# Patient Record
Sex: Female | Born: 1960 | Race: Black or African American | Hispanic: No | Marital: Married
Health system: Southern US, Community
[De-identification: ages and names within clinical notes are randomized; demographics above are authoritative.]

---

## 1999-01-22 ENCOUNTER — Encounter: Payer: Self-pay | Admitting: Obstetrics and Gynecology

## 1999-01-22 ENCOUNTER — Ambulatory Visit (HOSPITAL_COMMUNITY): Admission: RE | Admit: 1999-01-22 | Discharge: 1999-01-22 | Payer: Self-pay | Admitting: Obstetrics and Gynecology

## 1999-06-26 ENCOUNTER — Inpatient Hospital Stay (HOSPITAL_COMMUNITY): Admission: AD | Admit: 1999-06-26 | Discharge: 1999-06-28 | Payer: Self-pay | Admitting: Obstetrics & Gynecology

## 2000-12-20 ENCOUNTER — Other Ambulatory Visit: Admission: RE | Admit: 2000-12-20 | Discharge: 2000-12-20 | Payer: Self-pay | Admitting: *Deleted

## 2001-10-11 ENCOUNTER — Encounter (INDEPENDENT_AMBULATORY_CARE_PROVIDER_SITE_OTHER): Payer: Self-pay | Admitting: Specialist

## 2001-10-11 ENCOUNTER — Ambulatory Visit (HOSPITAL_COMMUNITY): Admission: RE | Admit: 2001-10-11 | Discharge: 2001-10-11 | Payer: Self-pay | Admitting: *Deleted

## 2002-01-09 ENCOUNTER — Other Ambulatory Visit: Admission: RE | Admit: 2002-01-09 | Discharge: 2002-01-09 | Payer: Self-pay | Admitting: *Deleted

## 2004-05-03 ENCOUNTER — Other Ambulatory Visit: Admission: RE | Admit: 2004-05-03 | Discharge: 2004-05-03 | Payer: Self-pay | Admitting: *Deleted

## 2004-09-01 ENCOUNTER — Ambulatory Visit (HOSPITAL_COMMUNITY): Admission: RE | Admit: 2004-09-01 | Discharge: 2004-09-01 | Payer: Self-pay | Admitting: *Deleted

## 2005-05-30 ENCOUNTER — Other Ambulatory Visit: Admission: RE | Admit: 2005-05-30 | Discharge: 2005-05-30 | Payer: Self-pay | Admitting: *Deleted

## 2005-08-17 ENCOUNTER — Encounter: Admission: RE | Admit: 2005-08-17 | Discharge: 2005-08-17 | Payer: Self-pay | Admitting: Endocrinology

## 2006-03-13 ENCOUNTER — Ambulatory Visit (HOSPITAL_COMMUNITY): Admission: RE | Admit: 2006-03-13 | Discharge: 2006-03-13 | Payer: Self-pay | Admitting: *Deleted

## 2006-06-26 ENCOUNTER — Other Ambulatory Visit: Admission: RE | Admit: 2006-06-26 | Discharge: 2006-06-26 | Payer: Self-pay | Admitting: *Deleted

## 2006-07-05 IMAGING — US US SOFT TISSUE HEAD/NECK
1 series · 14 of 25 positions shown · non-contrast
Comparison: none

CLINICAL DATA: Possible thyroid nodules felt on physical examination.
 THYROID ULTRASOUND:
TECHNIQUE: Ultrasound examination of the thyroid gland and adjacent soft tissue structures was performed.

[Series 1: us soft tissue head/neck · 0.08mm/px · 14 of 26 slices shown]
[im 1/26]
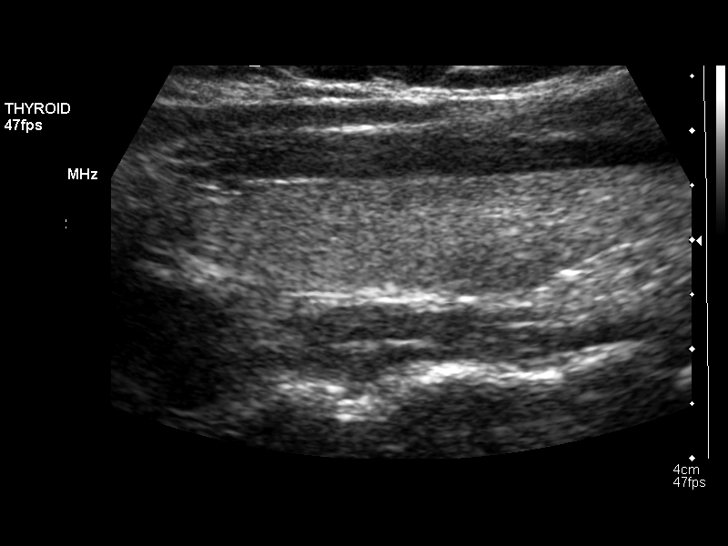
[im 3/26]
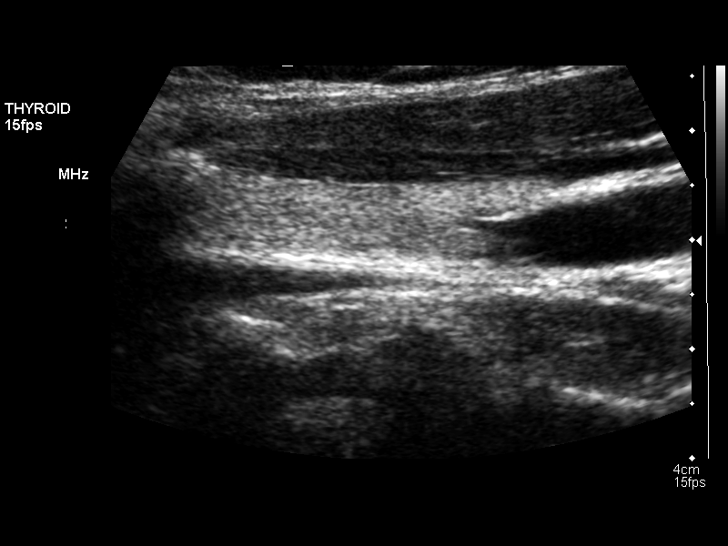
[im 5/26]
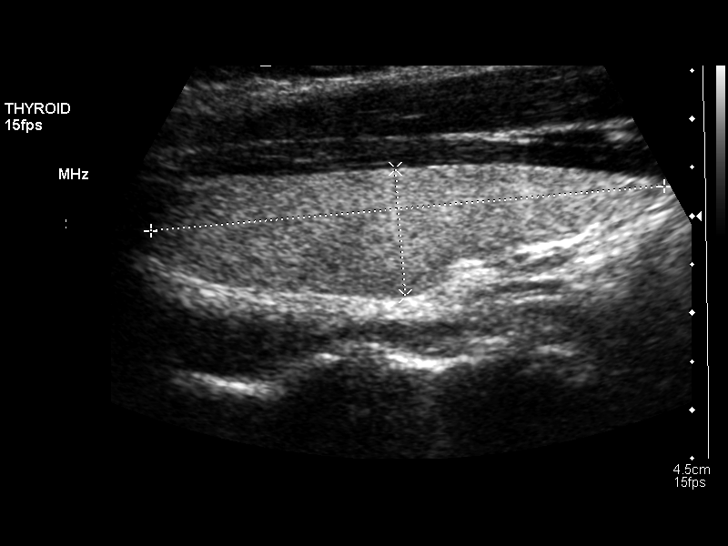
[im 7/26]
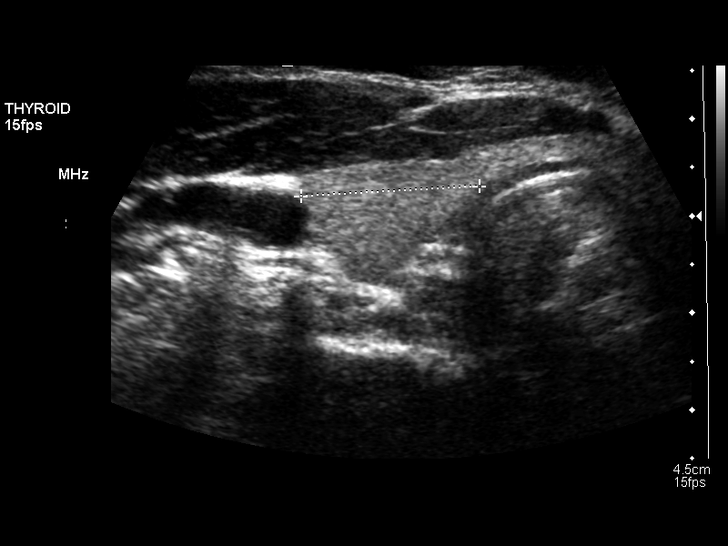
[im 9/26]
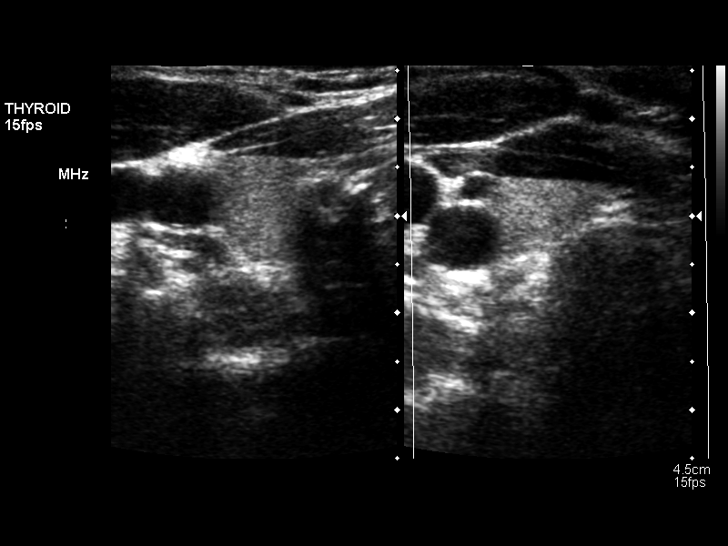
[im 10/26]
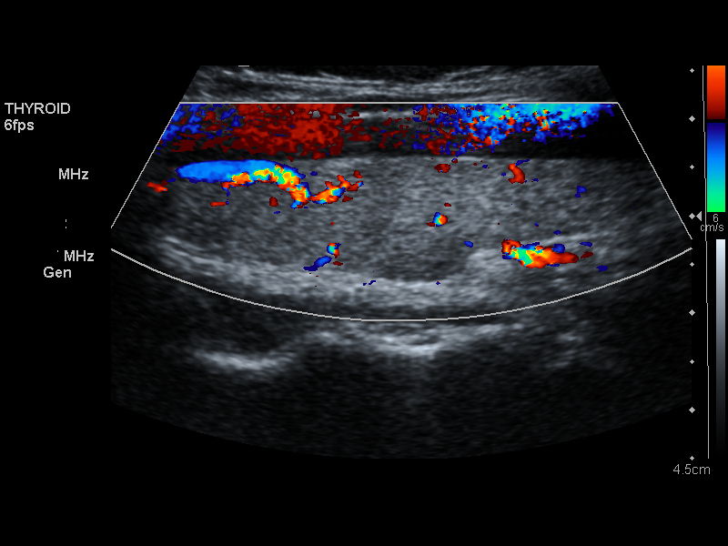
[im 12/26]
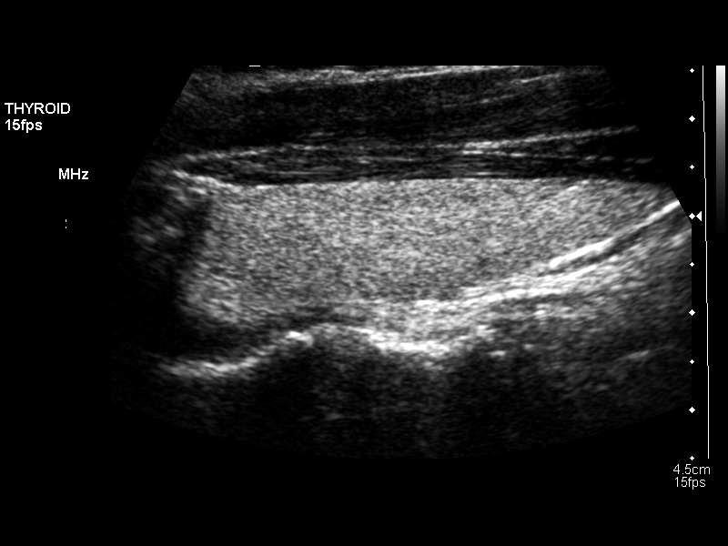
[im 14/26]
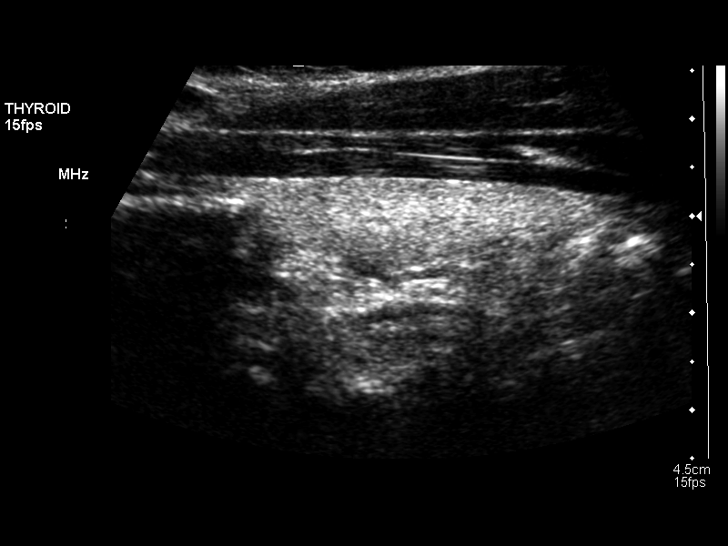
[im 16/26]
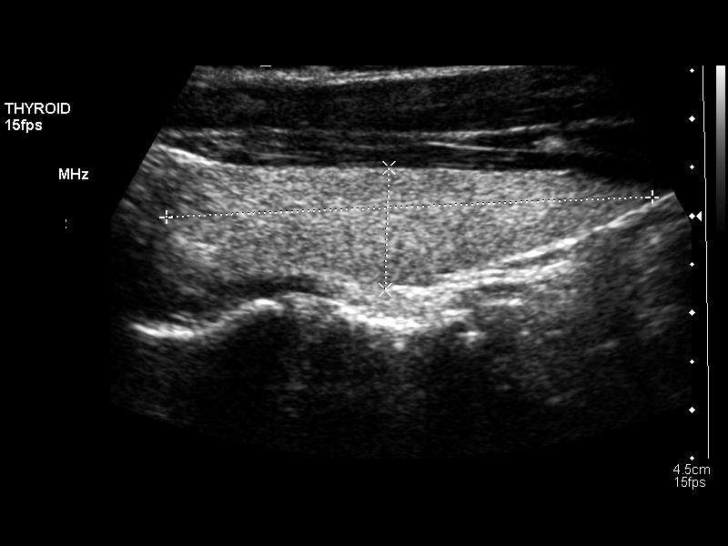
[im 17/26]
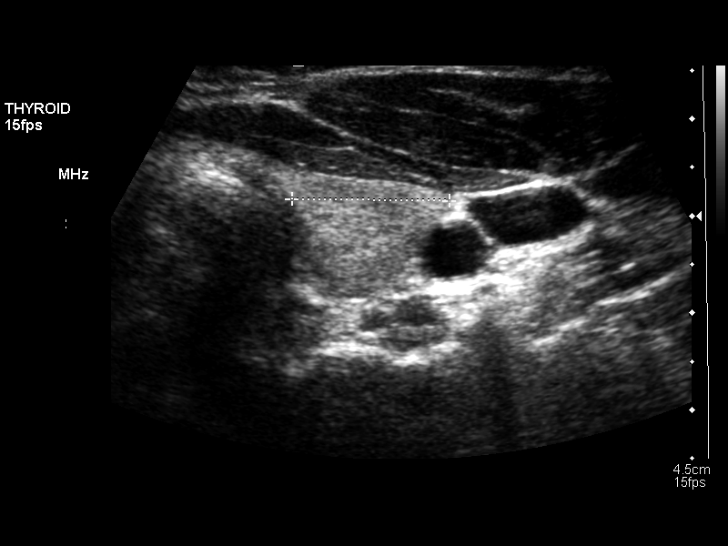
[im 19/26]
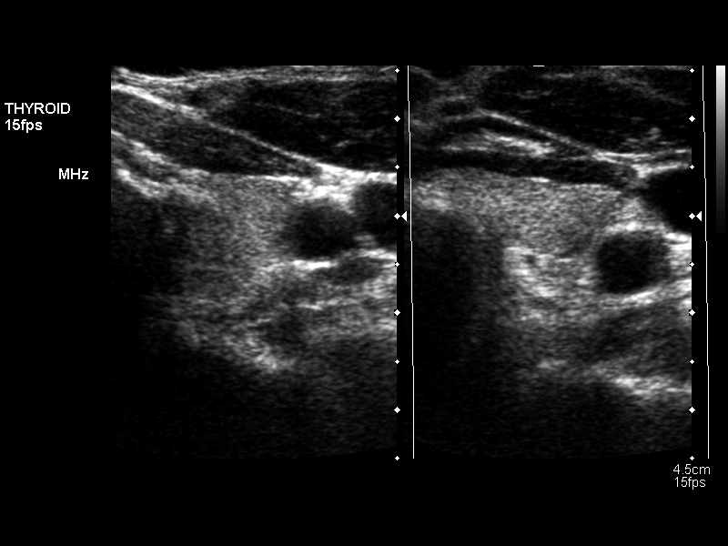
[im 21/26]
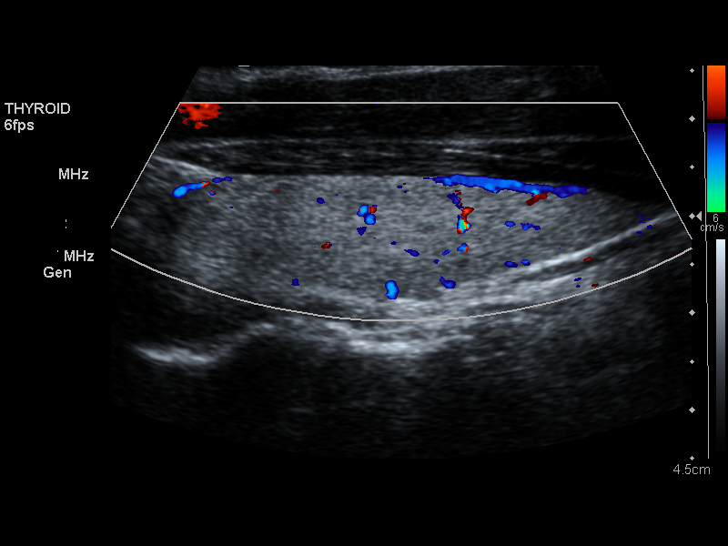
[im 23/26]
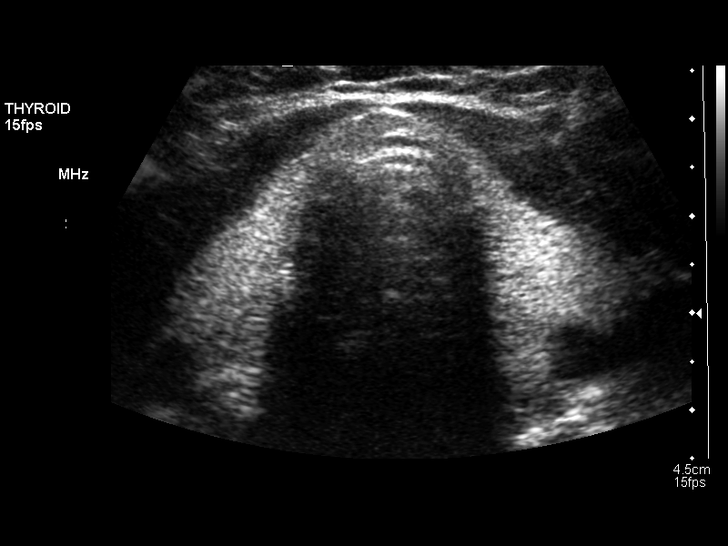
[im 26/26]
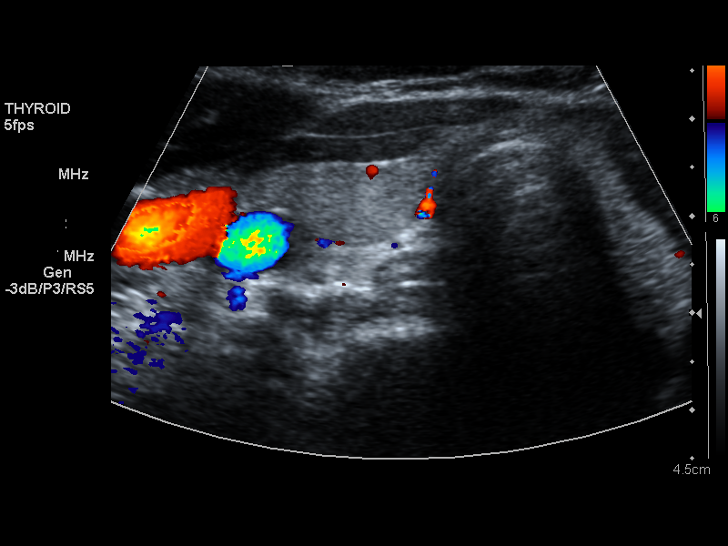

[14 of 25 positions shown; findings below may reference images not displayed]

FINDINGS: The right thyroid lobe measures 5.3 x 1.3 x 1.8 cm and the left lobe measures 5.0 x 1.3 x 1.6 cm.  The isthmus measures .2 cm.  The thyroid echotexture is homogeneous.  No lesions are seen.
IMPRESSION: Normal thyroid ultrasound examination.

## 2006-09-28 ENCOUNTER — Ambulatory Visit (HOSPITAL_COMMUNITY): Admission: RE | Admit: 2006-09-28 | Discharge: 2006-09-28 | Payer: Self-pay | Admitting: *Deleted

## 2007-07-02 ENCOUNTER — Other Ambulatory Visit: Admission: RE | Admit: 2007-07-02 | Discharge: 2007-07-02 | Payer: Self-pay | Admitting: *Deleted

## 2007-08-16 IMAGING — RF DG HYSTEROGRAM
3 series · 3 of 3 positions shown · non-contrast
Comparison: none

CLINICAL DATA: Infertility.  
HYSTEROSALPINGOGRAM:
TECHNIQUE: Hysterosalpingogram was performed by Dr. Denisha.  Three fluoroscopic spot images were submitted for interpretation.

[Series 1: run · 1 of 1 slices shown (1 of 3)]
[im 1/1]
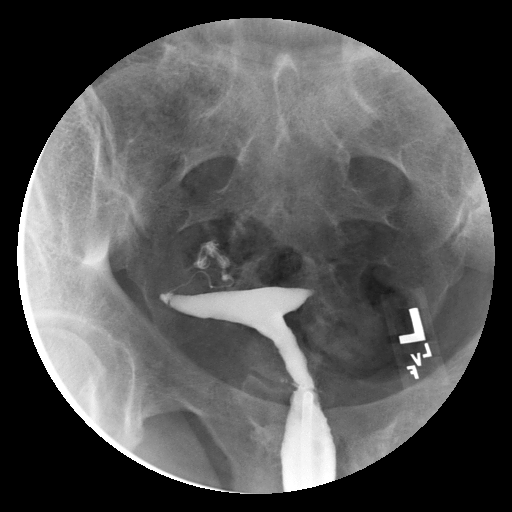

[Series 2: run · 1 of 1 slices shown (2 of 3)]
[im 1/1]
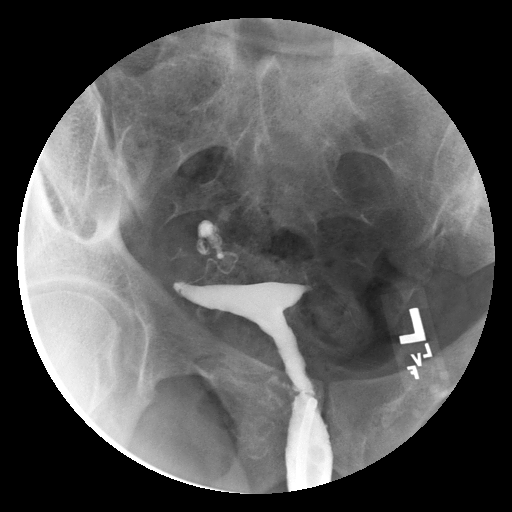

[Series 3: run · 1 of 1 slices shown (3 of 3)]
[im 1/1]
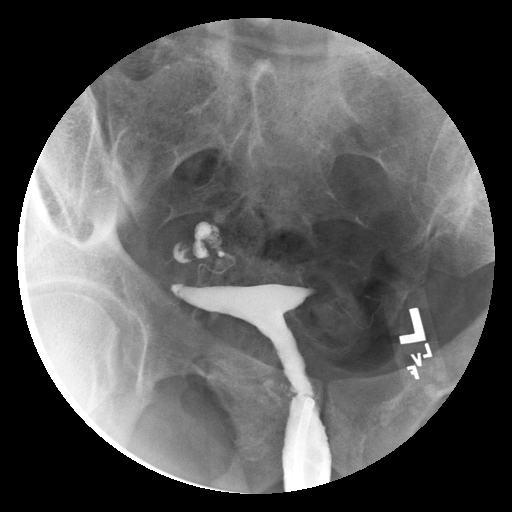

[3 of 3 positions shown; findings below may reference images not displayed]

FINDINGS: The endometrial cavity and the uterus are normal in contour and appearance.
Opacification of the right fallopian tube is seen.  On the final image there is a small ovoid contrast collection which may represent a small amount of intraperitoneal spill. 
There is opacification of the proximal left fallopian tube which is nondilated, however there is no contrast opacification of the distal portion of the left fallopian tube or intraperitoneal spill of contrast.
IMPRESSION: 1.  Normal appearance of right fallopian tube.  Possible small amount of intraperitoneal spill from the right tube seen on final image; recommend correlation with observations made during real-time fluoroscopy. 
2.  Incomplete opacification of the left fallopian tube, which terminates in the mid ampullary portion.
3.  Normal appearance of the endometrial cavity.

## 2010-08-25 ENCOUNTER — Ambulatory Visit (HOSPITAL_COMMUNITY)
Admission: RE | Admit: 2010-08-25 | Discharge: 2010-08-25 | Payer: Self-pay | Source: Home / Self Care | Attending: Family Medicine | Admitting: Family Medicine

## 2010-08-29 ENCOUNTER — Encounter: Payer: Self-pay | Admitting: *Deleted

## 2010-12-24 NOTE — Op Note (Signed)
[facility]  Patient:    Tiffany Mccann, Tiffany Mccann Visit Number: [id number] MRN: [id number]          Service Type: DSU Location: DAY Attending Physician:  Collene Schlichter Dictated by:   Almedia Balls. Randell Patient, M.D. Proc. Date: 10/11/01 Admit Date:  10/11/2001                             Operative Report  PREOPERATIVE DIAGNOSIS:  Intrauterine pregnancy approximately 8 weeks, missed abortion by ultrasound and falling quantitative hCG levels.  POSTOPERATIVE DIAGNOSIS:  Intrauterine pregnancy approximately 8 weeks, missed abortion by ultrasound and falling quantitative hCG levels, pending pathology.  OPERATION:  Suction D&E.  ANESTHESIA:  General LMA.  OPERATOR:  Almedia Balls. Fore, M.  INDICATION FOR SURGERY:  The patient is a 50 year old with intrauterine pregnancy at approximately [redacted] weeks gestational age with no fetal development by ultrasound and falling quantitative hCG levels.  She was counseled as to the problem and the need for surgery to evacuate the uterine contents.  She was fully counseled as to the nature of the procedure and the risks involved to include risks of anesthesia; injury to the uterus, tubes, ovaries, bowel, bladder, blood vessels, ureters, postoperative hemorrhage, infection, and recuperation.  She fully understands all of these considerations and wishes to proceed on October 11, 2001.  Blood type is B positive.  OPERATIVE FINDINGS:  On bimanual exam, the uterus was approximately [redacted] weeks gestational size.  There were no palpable adnexal masses.  Cervical os was dilated approximately 1 cm.  There was a small amount of bleeding occurring through the os.  DESCRIPTION OF PROCEDURE:  With the patient under general anesthesia, prepared and draped in the usual sterile fashion, a speculum was placed in the vagina, and the anterior lip of the cervix was grasped with a single-tooth tenaculum. Dilation of the cervix was unnecessary, and a #9 suction  vacurette was used for evacuation of the uterine contents.  Small, sharp curette was used for exploration, and the suction vacurette was again employed to ensure that all tissue was removed.  After noting that this was the case and that hemostasis was maintained, the procedure was terminated.  Estimated blood loss less 50 mL.  The patient was taken to the recovery room in good condition following catheterization free flow of clear urine.  FOLLOW-UP CARE:  She will return to the office in two weeks for follow-up and was instructed to call if heavy bleeding, pain, or unexplained fever should ensue.  She was given a prescription for doxycycline 100 mg #8 to be taken 2 STAT, 1 b.i.d., Darvocet-N 100 or generic #10 to be taken 1/2 to 1 q.4h. p.r.n. pain and Methargen 0.2 mg #8 to be taken 1 q.i.d.  She will call for any problems. Dictated by:   Almedia Balls Randell Patient, M.D. Attending Physician:  Collene Schlichter DD:  10/11/01 TD:  10/11/01 Job: [id number]

## 2019-10-31 ENCOUNTER — Ambulatory Visit: Payer: Self-pay | Attending: Internal Medicine

## 2019-10-31 DIAGNOSIS — Z23 Encounter for immunization: Secondary | ICD-10-CM

## 2019-10-31 NOTE — Progress Notes (Signed)
   Covid-19 Vaccination Clinic  Name:  Tiffany Mccann    MRN: [id number] DOB: [birth year]  10/31/2019  [name] was observed post Covid-19 immunization for 15 minutes without incident. She was provided with Vaccine Information Sheet and instruction to access the V-Safe system.   [name] was instructed to call 911 with any severe reactions post vaccine: Marland Kitchen Difficulty breathing  . Swelling of face and throat  . A fast heartbeat  . A bad rash all over body  . Dizziness and weakness   Immunizations Administered    Name Date Dose VIS Date Route   Moderna COVID-19 Vaccine 10/31/2019 12:38 PM 0.5 mL 07/09/2019 Intramuscular   Manufacturer: Moderna   Lot: [id number]   NDC: 96759-163-84

## 2019-11-28 ENCOUNTER — Ambulatory Visit: Payer: Self-pay | Attending: Internal Medicine

## 2019-11-28 DIAGNOSIS — Z23 Encounter for immunization: Secondary | ICD-10-CM

## 2019-11-28 NOTE — Progress Notes (Signed)
   Covid-19 Vaccination Clinic  Name:  Tiffany Mccann    MRN: [id number] DOB: [birth year]  11/28/2019  [name] was observed post Covid-19 immunization for 15 minutes without incident. She was provided with Vaccine Information Sheet and instruction to access the V-Safe system.   [name] was instructed to call 911 with any severe reactions post vaccine: Marland Kitchen Difficulty breathing  . Swelling of face and throat  . A fast heartbeat  . A bad rash all over body  . Dizziness and weakness   Immunizations Administered    Name Date Dose VIS Date Route   Moderna COVID-19 Vaccine 11/28/2019 12:29 PM 0.5 mL 07/2019 Intramuscular   Manufacturer: Moderna   Lot: [id number]   NDC: 79038-333-83
